# Patient Record
Sex: Female | Born: 1975 | Race: White | Hispanic: No | Marital: Single | Smoking: Never smoker
Health system: Southern US, Community
[De-identification: ages and names within clinical notes are randomized; demographics above are authoritative.]

## PROBLEM LIST (undated history)

## (undated) DIAGNOSIS — R87629 Unspecified abnormal cytological findings in specimens from vagina: Secondary | ICD-10-CM

## (undated) HISTORY — DX: Unspecified abnormal cytological findings in specimens from vagina: R87.629

---

## 1998-12-28 ENCOUNTER — Encounter: Payer: Self-pay | Admitting: Emergency Medicine

## 1998-12-28 ENCOUNTER — Emergency Department (HOSPITAL_COMMUNITY): Admission: EM | Admit: 1998-12-28 | Discharge: 1998-12-28 | Payer: Self-pay | Admitting: Emergency Medicine

## 2002-09-22 ENCOUNTER — Emergency Department (HOSPITAL_COMMUNITY): Admission: EM | Admit: 2002-09-22 | Discharge: 2002-09-22 | Payer: Self-pay | Admitting: Emergency Medicine

## 2003-03-01 ENCOUNTER — Other Ambulatory Visit: Admission: RE | Admit: 2003-03-01 | Discharge: 2003-03-01 | Payer: Self-pay | Admitting: Obstetrics & Gynecology

## 2004-02-02 ENCOUNTER — Ambulatory Visit (HOSPITAL_COMMUNITY): Admission: RE | Admit: 2004-02-02 | Discharge: 2004-02-02 | Payer: Self-pay | Admitting: Obstetrics & Gynecology

## 2004-04-16 ENCOUNTER — Inpatient Hospital Stay (HOSPITAL_COMMUNITY): Admission: AD | Admit: 2004-04-16 | Discharge: 2004-04-19 | Payer: Self-pay | Admitting: *Deleted

## 2004-06-20 ENCOUNTER — Other Ambulatory Visit: Admission: RE | Admit: 2004-06-20 | Discharge: 2004-06-20 | Payer: Self-pay | Admitting: Obstetrics & Gynecology

## 2005-01-02 ENCOUNTER — Other Ambulatory Visit: Admission: RE | Admit: 2005-01-02 | Discharge: 2005-01-02 | Payer: Self-pay | Admitting: Obstetrics and Gynecology

## 2005-01-02 ENCOUNTER — Ambulatory Visit: Payer: Self-pay | Admitting: Obstetrics and Gynecology

## 2005-05-05 ENCOUNTER — Encounter: Admission: RE | Admit: 2005-05-05 | Discharge: 2005-05-05 | Payer: Self-pay | Admitting: Otolaryngology

## 2006-11-06 ENCOUNTER — Emergency Department: Payer: Self-pay | Admitting: Emergency Medicine

## 2007-11-21 ENCOUNTER — Observation Stay: Payer: Self-pay | Admitting: Obstetrics and Gynecology

## 2007-12-24 ENCOUNTER — Ambulatory Visit: Payer: Self-pay | Admitting: Obstetrics and Gynecology

## 2007-12-26 ENCOUNTER — Inpatient Hospital Stay: Payer: Self-pay | Admitting: Obstetrics and Gynecology

## 2009-01-19 ENCOUNTER — Ambulatory Visit: Payer: Self-pay | Admitting: Obstetrics and Gynecology

## 2009-01-22 ENCOUNTER — Inpatient Hospital Stay: Payer: Self-pay | Admitting: Obstetrics and Gynecology

## 2010-01-10 ENCOUNTER — Emergency Department: Payer: Self-pay | Admitting: Emergency Medicine

## 2010-01-10 IMAGING — CR DG CHEST 2V
1 series · 2 of 2 positions shown · non-contrast
Comparison: none

REASON FOR EXAM: cough and fever
COMMENTS:

PROCEDURE:     DXR - DXR CHEST PA (OR AP) AND LATERAL  - January 10, 2010  [DATE]
RESULT:     The lung fields are clear. The heart, mediastinal and osseous
structures show no acute changes. Incidental note is made of a slight
thoracic scoliosis with a convexity to the left.

[Series 1: view not recorded · 0.17mm/px · 2 of 2 slices shown]
[im 1/2]
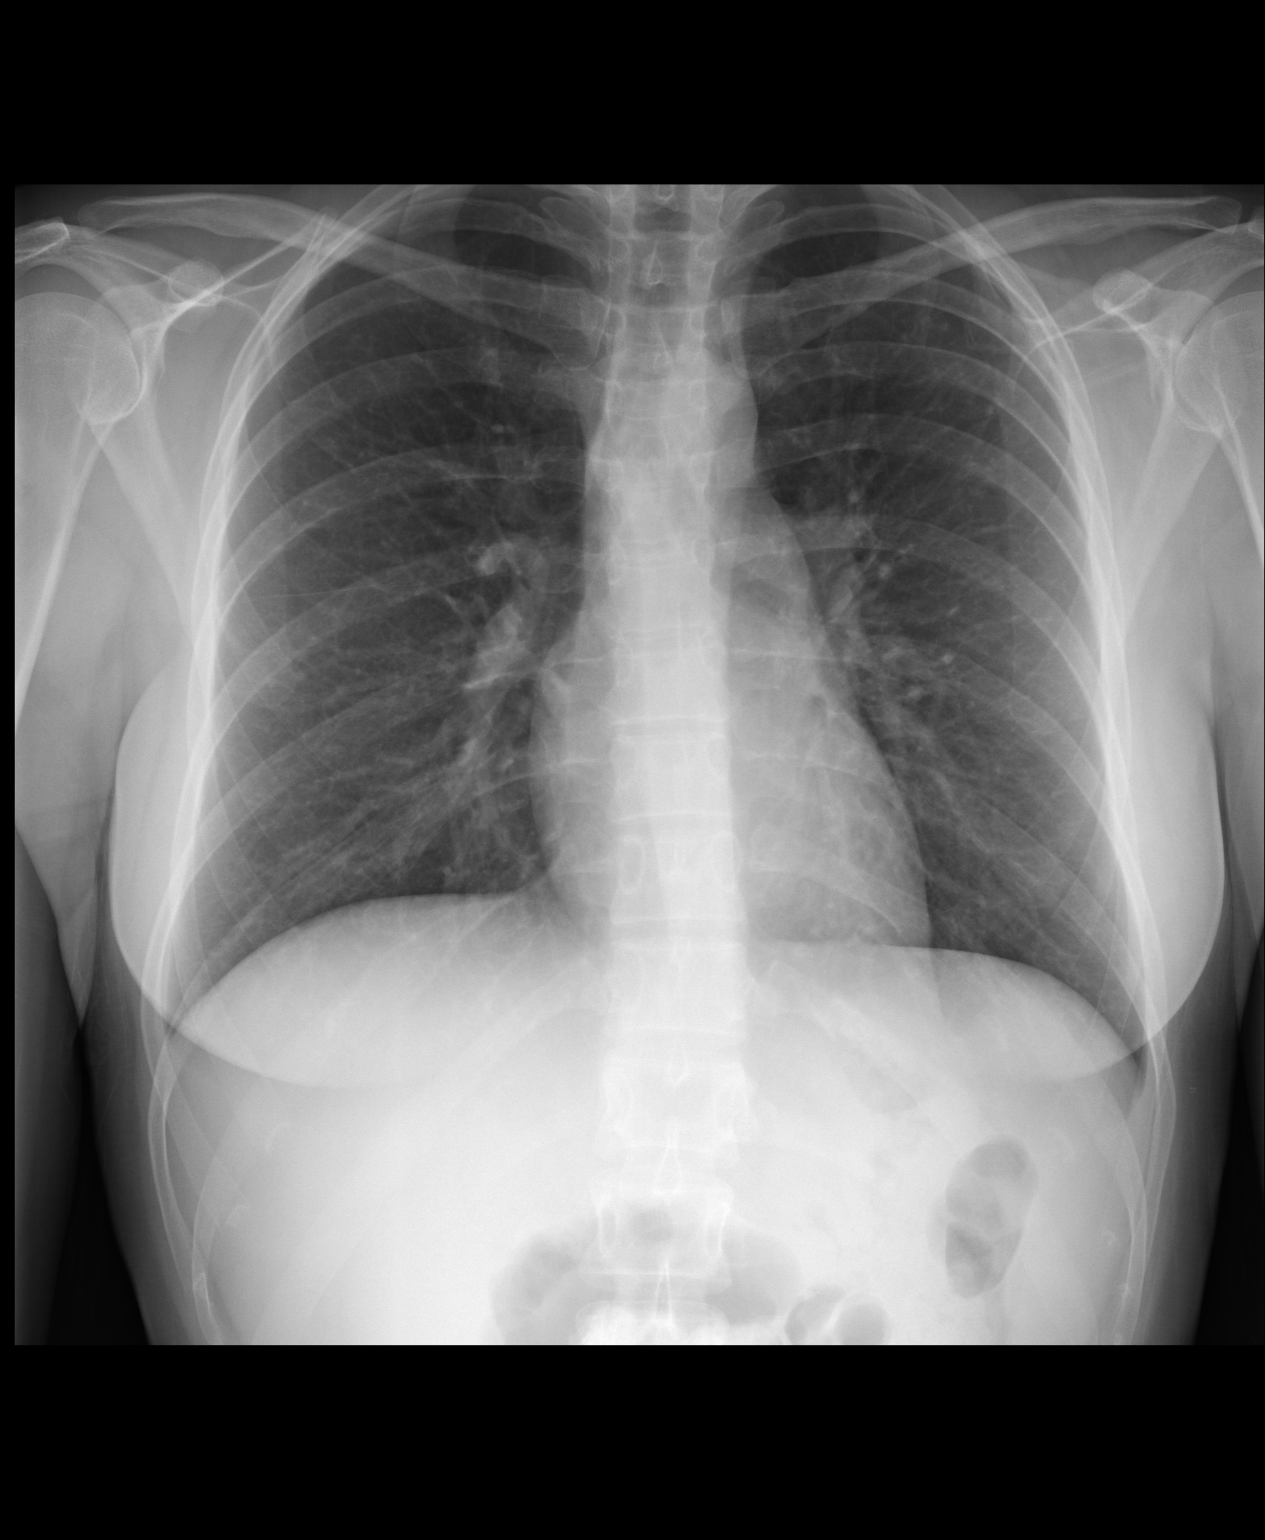
[im 2/2]
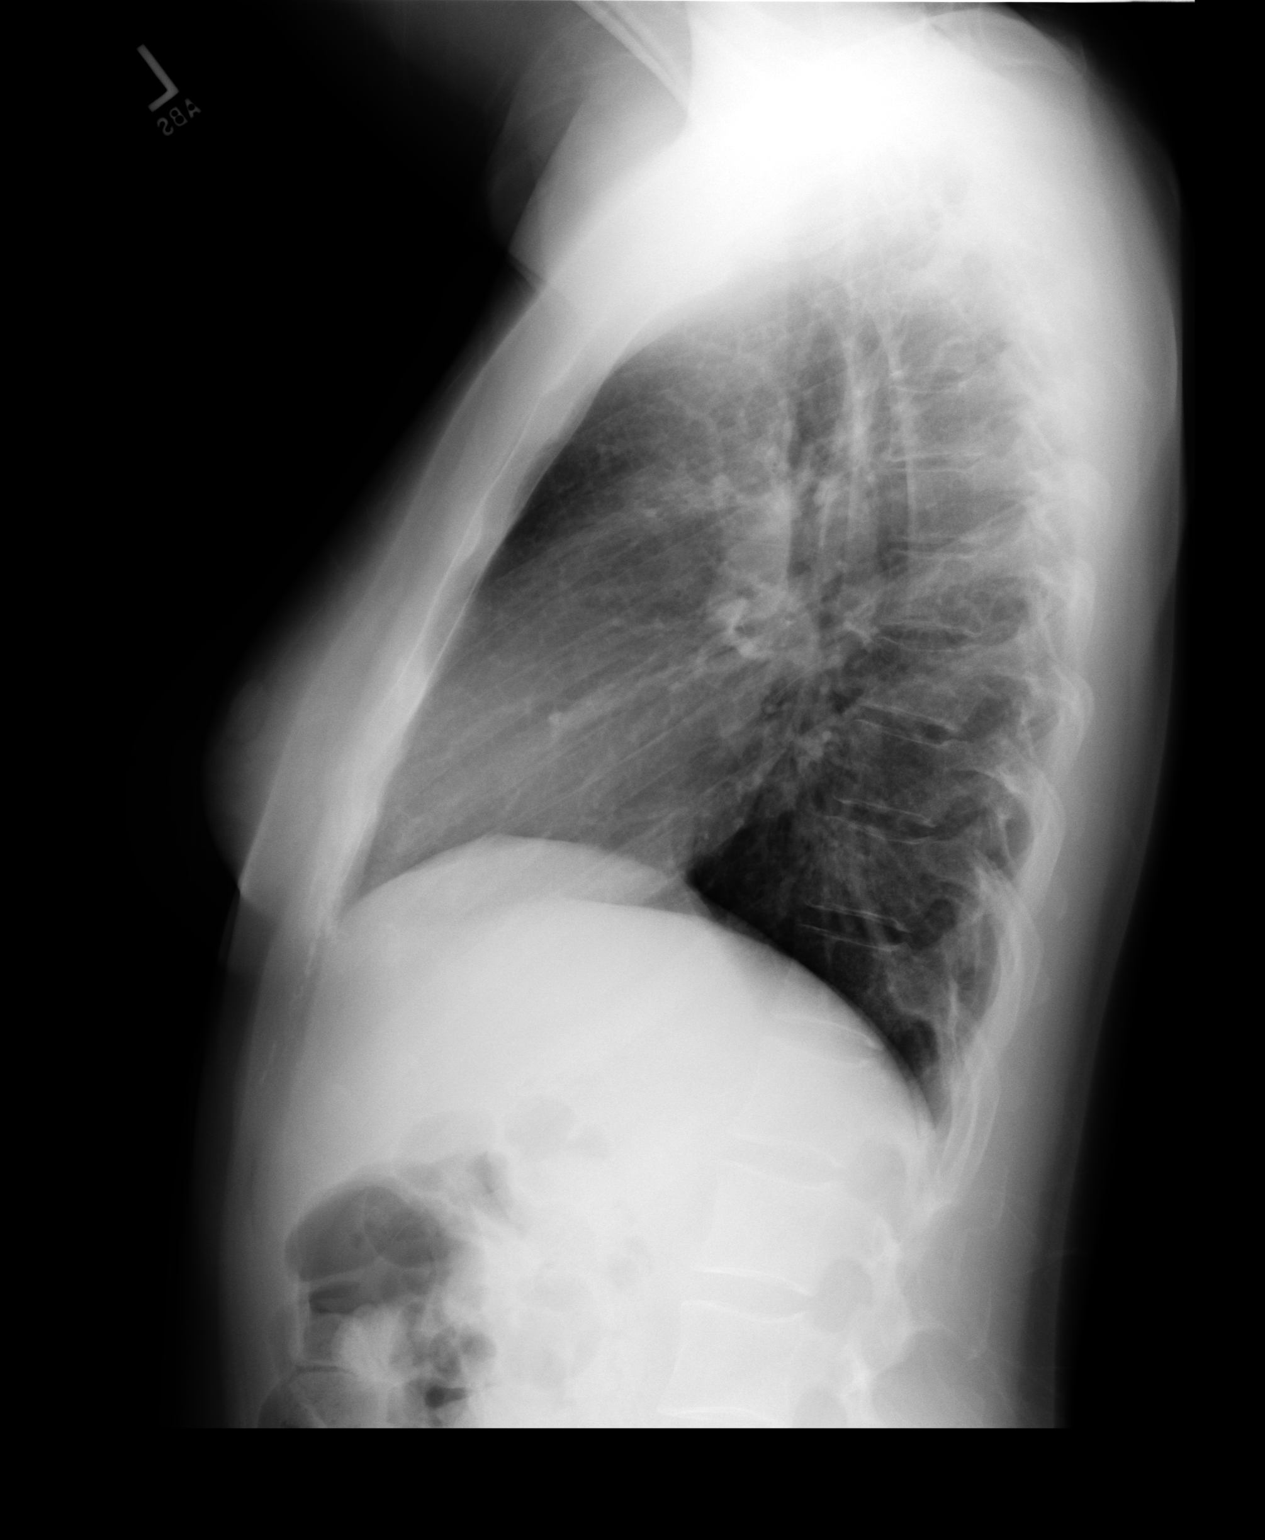

[2 of 2 positions shown; findings below may reference images not displayed]

IMPRESSION: No acute changes are identified.

## 2014-02-24 ENCOUNTER — Ambulatory Visit: Payer: Self-pay | Admitting: Family Medicine

## 2014-02-24 IMAGING — US TRANSABDOMINAL ULTRASOUND OF PELVIS
1 series · 14 of 25 positions shown · non-contrast
Comparison: None.

CLINICAL DATA: Abnormal vaginal bleeding.

EXAM:
TRANSABDOMINAL ULTRASOUND OF PELVIS
TECHNIQUE: Transabdominal ultrasound examination of the pelvis was performed
including evaluation of the uterus, ovaries, adnexal regions, and
pelvic cul-de-sac.

[Series 1: transabdominal ultrasound of pelvis · 0.20mm/px · 14 of 82 slices shown]
[im 1/82]
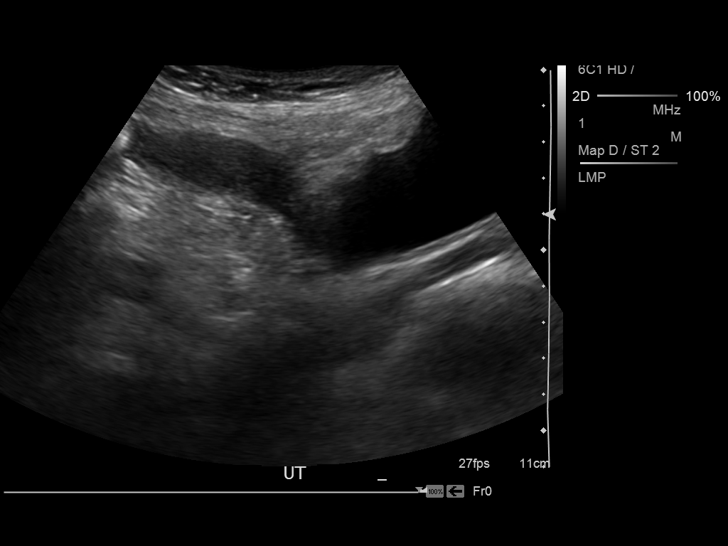
[im 7/82]
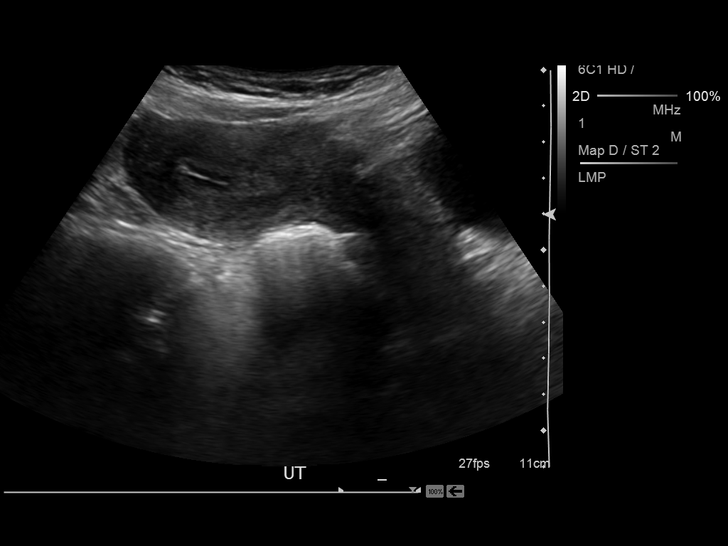
[im 14/82]
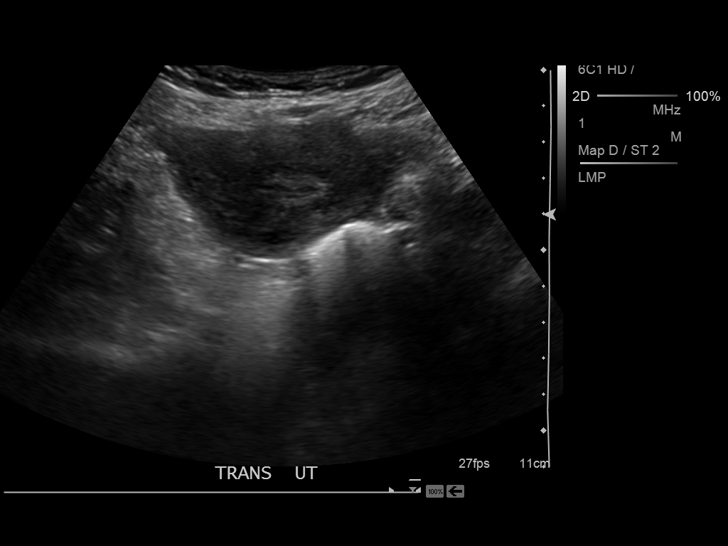
[im 21/82]
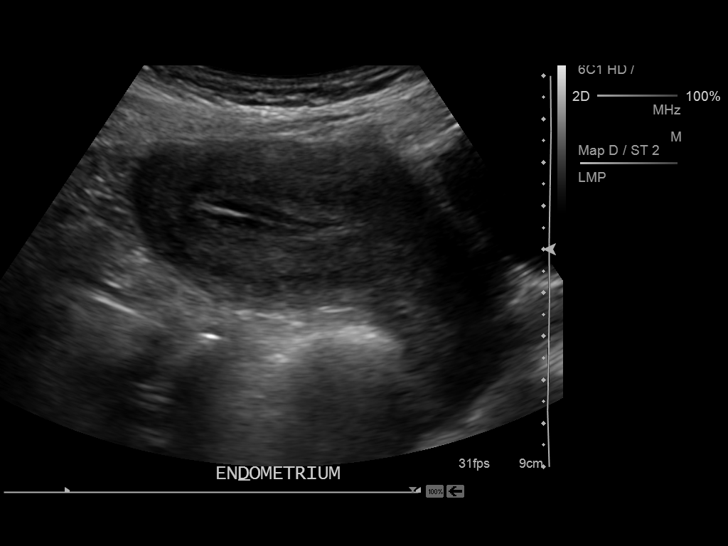
[im 28/82]
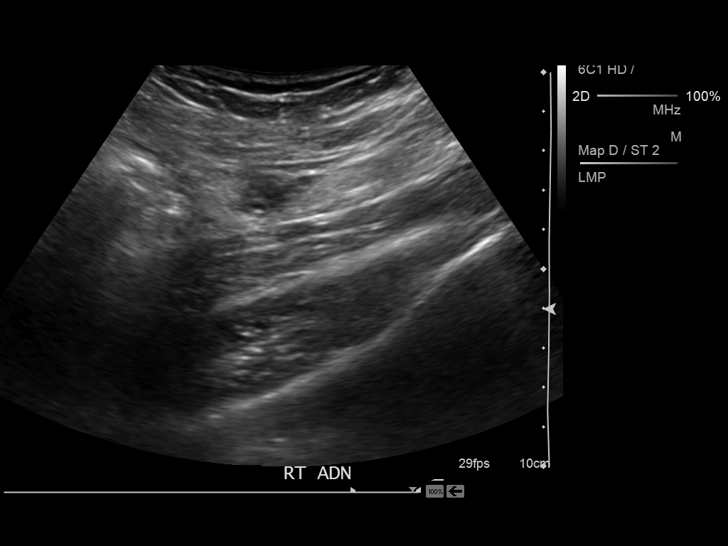
[im 31/82]
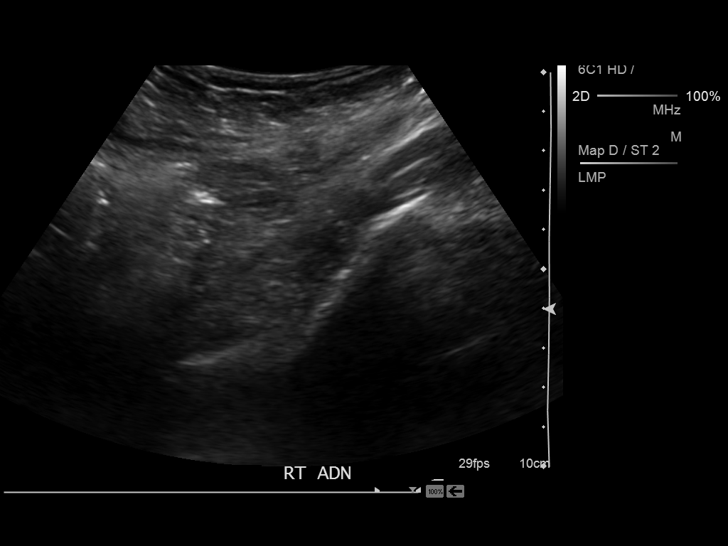
[im 38/82]
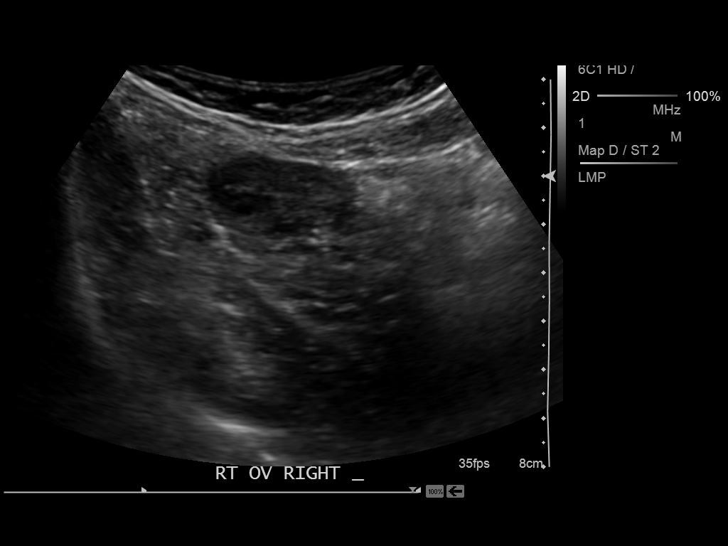
[im 44/82]
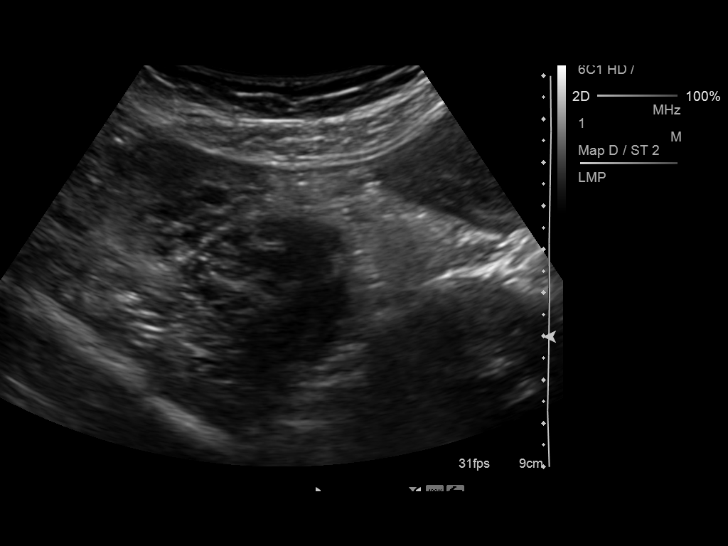
[im 51/82]
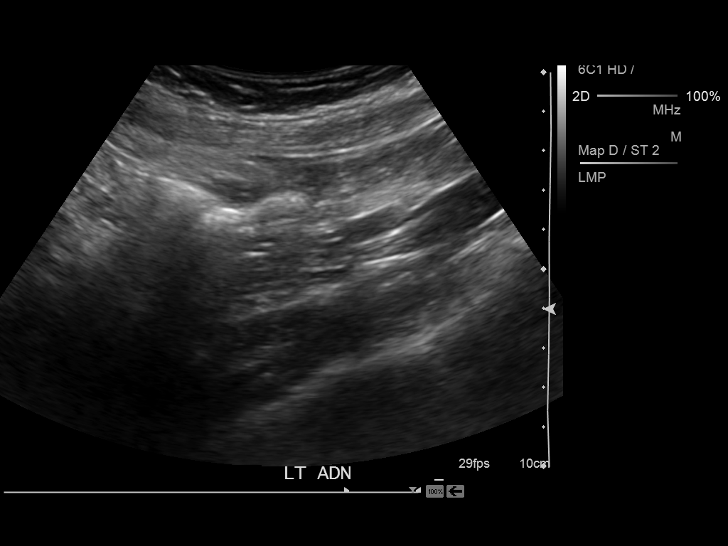
[im 55/82]
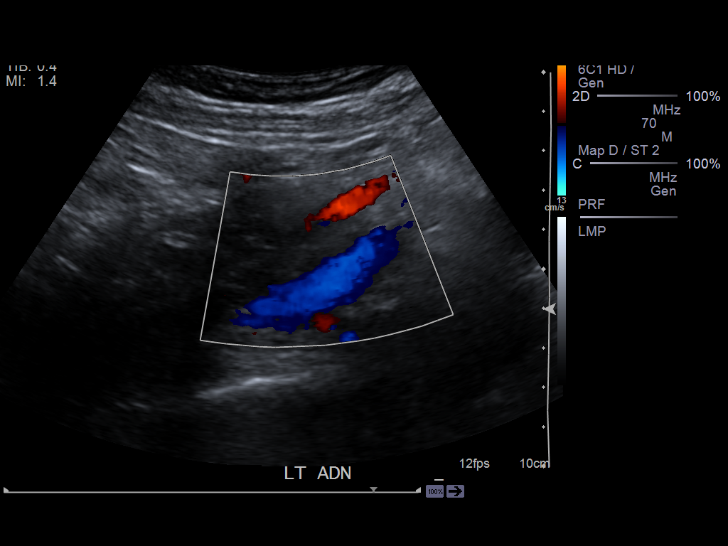
[im 61/82]
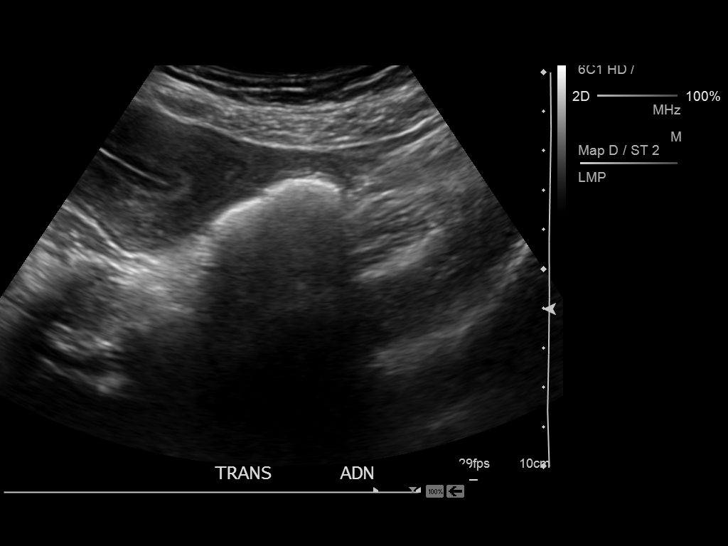
[im 68/82]
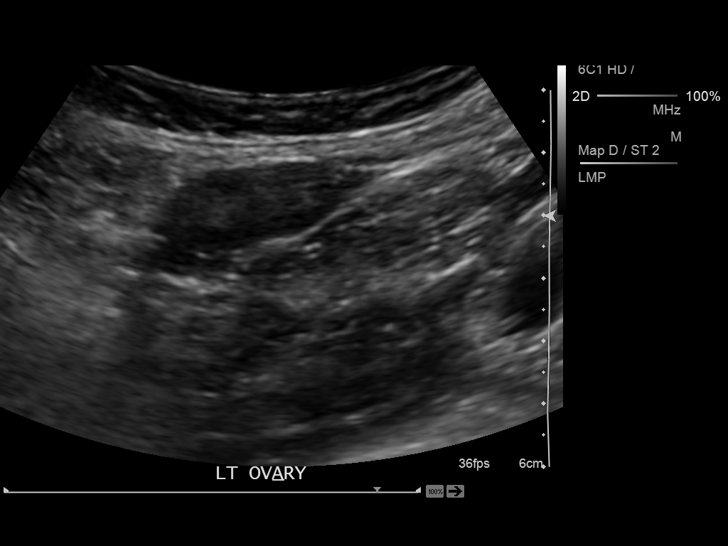
[im 75/82]
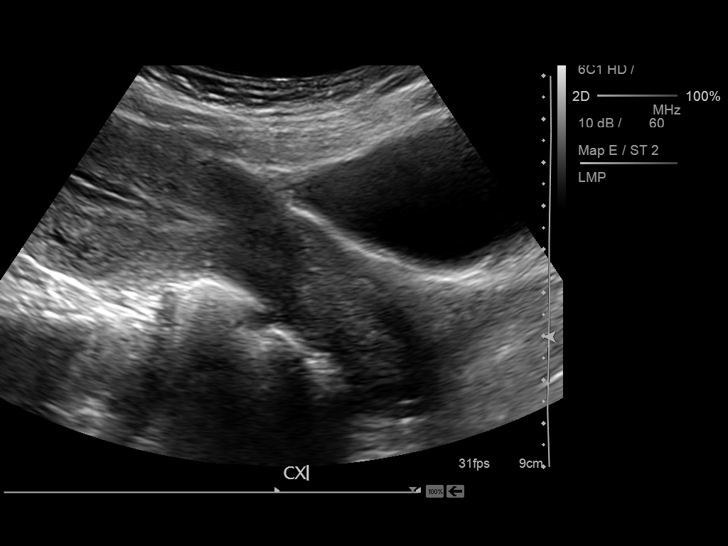
[im 82/82]
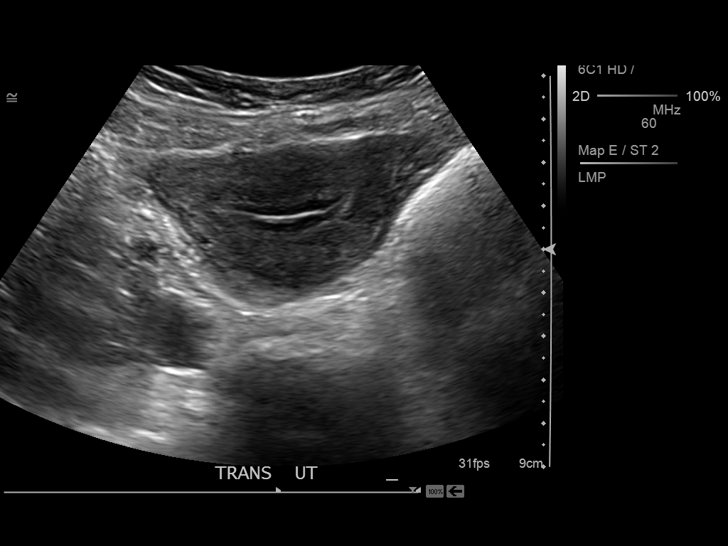

[14 of 25 positions shown; findings below may reference images not displayed]

FINDINGS: Uterus

Measurements: 9.3 cm x 3.4 cm x 6.0 cm. No fibroids or other mass
visualized.

Endometrium

Thickness: 7.2 mm.  No focal abnormality visualized.

Right ovary

Measurements: 39 mm x 17 mm x 31 mm. Normal appearance/no adnexal
mass.

Left ovary

Measurements: 30 mm x 12 mm x 17 mm. Normal appearance/no adnexal
mass.

Other findings:  No free fluid
IMPRESSION: Normal transabdominal pelvic ultrasound.

## 2016-12-29 HISTORY — PX: PLACEMENT OF BREAST IMPLANTS: SHX6334

## 2022-07-31 ENCOUNTER — Ambulatory Visit: Payer: Commercial Managed Care - HMO | Admitting: Obstetrics and Gynecology

## 2022-07-31 ENCOUNTER — Encounter: Payer: Self-pay | Admitting: Obstetrics and Gynecology

## 2022-07-31 ENCOUNTER — Other Ambulatory Visit (HOSPITAL_COMMUNITY)
Admission: RE | Admit: 2022-07-31 | Discharge: 2022-07-31 | Disposition: A | Discharge status: Home / Self Care | Payer: Commercial Managed Care - HMO | Source: Ambulatory Visit | Attending: Obstetrics and Gynecology | Admitting: Obstetrics and Gynecology

## 2022-07-31 VITALS — BP 113/74 | HR 85 | Ht 64.0 in | Wt 172.0 lb

## 2022-07-31 DIAGNOSIS — N920 Excessive and frequent menstruation with regular cycle: Secondary | ICD-10-CM | POA: Diagnosis not present

## 2022-07-31 DIAGNOSIS — Z113 Encounter for screening for infections with a predominantly sexual mode of transmission: Secondary | ICD-10-CM | POA: Diagnosis not present

## 2022-07-31 DIAGNOSIS — Z1231 Encounter for screening mammogram for malignant neoplasm of breast: Secondary | ICD-10-CM

## 2022-07-31 DIAGNOSIS — Z01419 Encounter for gynecological examination (general) (routine) without abnormal findings: Secondary | ICD-10-CM

## 2022-07-31 DIAGNOSIS — Z758 Other problems related to medical facilities and other health care: Secondary | ICD-10-CM

## 2022-07-31 MED ORDER — TRANEXAMIC ACID 650 MG PO TABS: 1300.0000 mg | ORAL_TABLET | Freq: Three times a day (TID) | ORAL | 2 refills | Status: DC

## 2022-07-31 NOTE — Progress Notes (Signed)
Obstetrics and Gynecology New Patient Evaluation  Appointment Date: 07/31/2022  OBGYN Clinic: [facility]  Primary Care Provider: None  Referring Provider: Self  Chief Complaint:  Chief Complaint  Patient presents with   Gynecologic Exam    History of Present Illness: Abigail Gutierrez is a 46 y.o. G3P3003 (Patient's last menstrual period was 07/31/2022 (exact date).), seen for the above chief complaint. Her past medical history is significant for h/o c-section x 2 and BTL.  Period just started today. Long h/o heavy periods that can last for over a week, not really painful.    Review of Systems: Pertinent items noted in HPI and remainder of comprehensive ROS otherwise negative.   Past Medical History:  Past Medical History:  Diagnosis Date   Vaginal Pap smear, abnormal     Past Surgical History:  Past Surgical History:  Procedure Laterality Date   CESAREAN SECTION     CESAREAN SECTION W/BTL     PLACEMENT OF BREAST IMPLANTS Bilateral 2018    Past Obstetrical History:  OB History  Gravida Para Term Preterm AB Living  3 3 3     3   SAB IAB Ectopic Multiple Live Births          3    # Outcome Date GA Lbr Len/2nd Weight Sex Delivery Anes PTL Lv  3 Term 01/22/09     CS-LTranv   LIV  2 Term 12/26/07     CS-LTranv   LIV  1 Term 04/17/04     Vag-Spont   LIV    Past Gynecological History: As per HPI. Periods: qmonth, regular, heavy, not really painful, can last for over a week.  History of Pap Smear(s): Yes.   Many years ago  Social History:  Social History   Socioeconomic History   Marital status: Single    Spouse name: Not on file   Number of children: Not on file   Years of education: Not on file   Highest education level: Not on file  Occupational History   Not on file  Tobacco Use   Smoking status: Never   Smokeless tobacco: Never  Vaping Use   Vaping Use: Every day  Substance and Sexual Activity   Alcohol use: Yes   Drug  use: Not Currently   Sexual activity: Yes    Birth control/protection: None  Other Topics Concern   Not on file  Social History Narrative   Not on file   Social Determinants of Health   Financial Resource Strain: Not on file  Food Insecurity: Not on file  Transportation Needs: Not on file  Physical Activity: Not on file  Stress: Not on file  Social Connections: Not on file  Intimate Partner Violence: Not on file    Family History: History reviewed. No pertinent family history.  Medications Marquis R. Lansdale had no medications administered during this visit. No current outpatient medications on file.   No current facility-administered medications for this visit.    Allergies Penicillins   Physical Exam:  BP 113/74   Pulse 85   Ht 5\' 4"  (1.626 m)   Wt 172 lb (78 kg)   LMP 07/31/2022 (Exact Date)   BMI 29.52 kg/m  Body mass index is 29.52 kg/m. General appearance: Well nourished, well developed female in no acute distress.  Neck:  Supple, normal appearance, and no thyromegaly  Cardiovascular: normal s1 and s2.  No murmurs, rubs or gallops. Respiratory:  Clear to auscultation bilateral. Normal respiratory effort  Abdomen: positive bowel sounds and no masses, hernias; diffusely non tender to palpation, non distended Breasts: breasts appear normal, no suspicious masses, no skin or nipple changes or axillary nodes, and normal palpation. Neuro/Psych:  Normal mood and affect.  Skin:  Warm and dry.  Lymphatic:  No inguinal lymphadenopathy.   Pelvic exam: is not limited by body habitus EGBUS: within normal limits Vagina: within normal limits +blood in the vulat Cervix: normal appearing cervix without tenderness, discharge or lesions. Uterus:  nonenlarged and non tender Adnexa:  normal adnexa and no mass, fullness, tenderness Rectovaginal: deferred  Laboratory: none  Radiology: none  Assessment: pt doing well  Plan:  1. Screen for STD (sexually transmitted  disease) - Cytology - PAP - RPR+HBsAg+HCVAb+...  2. Well woman exam with routine gynecological exam Routine labs today - Cytology - PAP - MM 3D SCREEN BREAST W/IMPLANT BILATERAL; Future - RPR+HBsAg+HCVAb+... - TSH Rfx on Abnormal to Free T4 - Comprehensive metabolic panel - Urinalysis, Routine w reflex microscopic - Hemoglobin A1c - Lipid panel  3. Breast cancer screening by mammogram - MM 3D SCREEN BREAST W/IMPLANT BILATERAL; Future  4. Menorrhagia with regular cycle Options, r/b/a d/w pt and is amenable to a trial of cyclic Lysteda which was sent in for her and she was told she could start that today  RTC PRN  Cornelia Copa MD Attending [facility])

## 2022-07-31 NOTE — Progress Notes (Signed)
Has concern with weight gain, 30lb over the last 30months  Last pap maybe 8 years ago

## 2022-08-01 LAB — COMPREHENSIVE METABOLIC PANEL
ALT: 22 IU/L (ref 0–32)
AST: 21 IU/L (ref 0–40)
Albumin/Globulin Ratio: 2 (ref 1.2–2.2)
Albumin: 4.7 g/dL (ref 3.9–4.9)
Alkaline Phosphatase: 95 IU/L (ref 44–121)
BUN/Creatinine Ratio: 16 (ref 9–23)
BUN: 14 mg/dL (ref 6–24)
Bilirubin Total: 0.3 mg/dL (ref 0.0–1.2)
CO2: 24 mmol/L (ref 20–29)
Calcium: 9.5 mg/dL (ref 8.7–10.2)
Chloride: 102 mmol/L (ref 96–106)
Creatinine, Ser: 0.85 mg/dL (ref 0.57–1.00)
Globulin, Total: 2.4 g/dL (ref 1.5–4.5)
Glucose: 82 mg/dL (ref 70–99)
Potassium: 4.4 mmol/L (ref 3.5–5.2)
Sodium: 140 mmol/L (ref 134–144)
Total Protein: 7.1 g/dL (ref 6.0–8.5)
eGFR: 87 mL/min/{1.73_m2} (ref >59)

## 2022-08-01 LAB — LIPID PANEL
Chol/HDL Ratio: 4.5 ratio — ABNORMAL HIGH (ref 0.0–4.4)
Cholesterol, Total: 223 mg/dL — ABNORMAL HIGH (ref 100–199)
HDL: 50 mg/dL (ref >39)
LDL Chol Calc (NIH): 136 mg/dL — ABNORMAL HIGH (ref 0–99)
Triglycerides: 207 mg/dL — ABNORMAL HIGH (ref 0–149)
VLDL Cholesterol Cal: 37 mg/dL (ref 5–40)

## 2022-08-01 LAB — HEMOGLOBIN A1C
Est. average glucose Bld gHb Est-mCnc: 111 mg/dL
Hgb A1c MFr Bld: 5.5 % (ref 4.8–5.6)

## 2022-08-01 LAB — URINALYSIS, ROUTINE W REFLEX MICROSCOPIC
Bilirubin, UA: NEGATIVE
Glucose, UA: NEGATIVE
Ketones, UA: NEGATIVE
Nitrite, UA: NEGATIVE
Specific Gravity, UA: 1.025 (ref 1.005–1.030)
Urobilinogen, Ur: 1 mg/dL (ref 0.2–1.0)
pH, UA: 6.5 (ref 5.0–7.5)

## 2022-08-01 LAB — MICROSCOPIC EXAMINATION
Casts: NONE SEEN /lpf
Epithelial Cells (non renal): >10 /hpf — AB (ref 0–10)
RBC, Urine: >30 /hpf — AB (ref 0–2)

## 2022-08-01 LAB — RPR+HBSAG+HCVAB+...
HIV Screen 4th Generation wRfx: NONREACTIVE
Hep C Virus Ab: NONREACTIVE
Hepatitis B Surface Ag: NEGATIVE
RPR Ser Ql: NONREACTIVE

## 2022-08-01 LAB — TSH RFX ON ABNORMAL TO FREE T4: TSH: 0.765 u[IU]/mL (ref 0.450–4.500)

## 2022-08-07 ENCOUNTER — Encounter: Payer: Self-pay | Admitting: Obstetrics and Gynecology

## 2022-08-07 DIAGNOSIS — E7800 Pure hypercholesterolemia, unspecified: Secondary | ICD-10-CM | POA: Insufficient documentation

## 2022-08-07 DIAGNOSIS — E781 Pure hyperglyceridemia: Secondary | ICD-10-CM | POA: Insufficient documentation

## 2022-08-07 LAB — CYTOLOGY - PAP
Chlamydia: NEGATIVE
Comment: NEGATIVE
Comment: NEGATIVE
Comment: NEGATIVE
Comment: NORMAL
Diagnosis: NEGATIVE
High risk HPV: NEGATIVE
Neisseria Gonorrhea: NEGATIVE
Trichomonas: NEGATIVE

## 2022-08-12 ENCOUNTER — Encounter: Payer: Self-pay | Admitting: Obstetrics and Gynecology

## 2022-10-23 ENCOUNTER — Encounter: Payer: Self-pay | Admitting: Obstetrics and Gynecology

## 2022-10-27 ENCOUNTER — Other Ambulatory Visit: Payer: Self-pay | Admitting: Obstetrics and Gynecology

## 2022-10-27 DIAGNOSIS — Z01419 Encounter for gynecological examination (general) (routine) without abnormal findings: Secondary | ICD-10-CM

## 2022-10-27 MED ORDER — TRANEXAMIC ACID 650 MG PO TABS: 1300.0000 mg | ORAL_TABLET | Freq: Three times a day (TID) | ORAL | 3 refills | Status: AC

## 2023-05-13 DIAGNOSIS — Z1231 Encounter for screening mammogram for malignant neoplasm of breast: Secondary | ICD-10-CM

## 2023-09-03 ENCOUNTER — Ambulatory Visit: Payer: 59 | Admitting: Nurse Practitioner

## 2023-11-11 ENCOUNTER — Ambulatory Visit: Payer: 59 | Admitting: Nurse Practitioner

## 2023-12-04 ENCOUNTER — Ambulatory Visit: Payer: 59 | Admitting: Nurse Practitioner

## 2024-01-18 ENCOUNTER — Ambulatory Visit: Payer: 59 | Admitting: Obstetrics and Gynecology
